# Patient Record
Sex: Male | Born: 1940 | Race: White | Hispanic: No | Marital: Married | Smoking: Never smoker
Health system: Southern US, Community
[De-identification: ages and names within clinical notes are randomized; demographics above are authoritative.]

## PROBLEM LIST (undated history)

## (undated) DIAGNOSIS — I639 Cerebral infarction, unspecified: Secondary | ICD-10-CM

## (undated) DIAGNOSIS — E7800 Pure hypercholesterolemia, unspecified: Secondary | ICD-10-CM

## (undated) DIAGNOSIS — I82409 Acute embolism and thrombosis of unspecified deep veins of unspecified lower extremity: Secondary | ICD-10-CM

## (undated) DIAGNOSIS — Z87438 Personal history of other diseases of male genital organs: Secondary | ICD-10-CM

## (undated) DIAGNOSIS — N319 Neuromuscular dysfunction of bladder, unspecified: Secondary | ICD-10-CM

## (undated) HISTORY — PX: BACK SURGERY: SHX140

---

## 2003-01-06 ENCOUNTER — Emergency Department (HOSPITAL_COMMUNITY): Admission: EM | Admit: 2003-01-06 | Discharge: 2003-01-07 | Payer: Self-pay | Admitting: Emergency Medicine

## 2008-10-04 ENCOUNTER — Emergency Department (HOSPITAL_COMMUNITY): Admission: EM | Admit: 2008-10-04 | Discharge: 2008-10-04 | Payer: Self-pay | Admitting: Emergency Medicine

## 2010-09-23 IMAGING — CR DG CERVICAL SPINE 1V CLEARING
1 series · 1 of 1 positions shown · non-contrast
Comparison: None.

CLINICAL DATA: Fell from ladder.  Neck pain.

LIMITED CERVICAL SPINE FOR TRAUMA CLEARING - 1 VIEW

[w c-spine lat *]
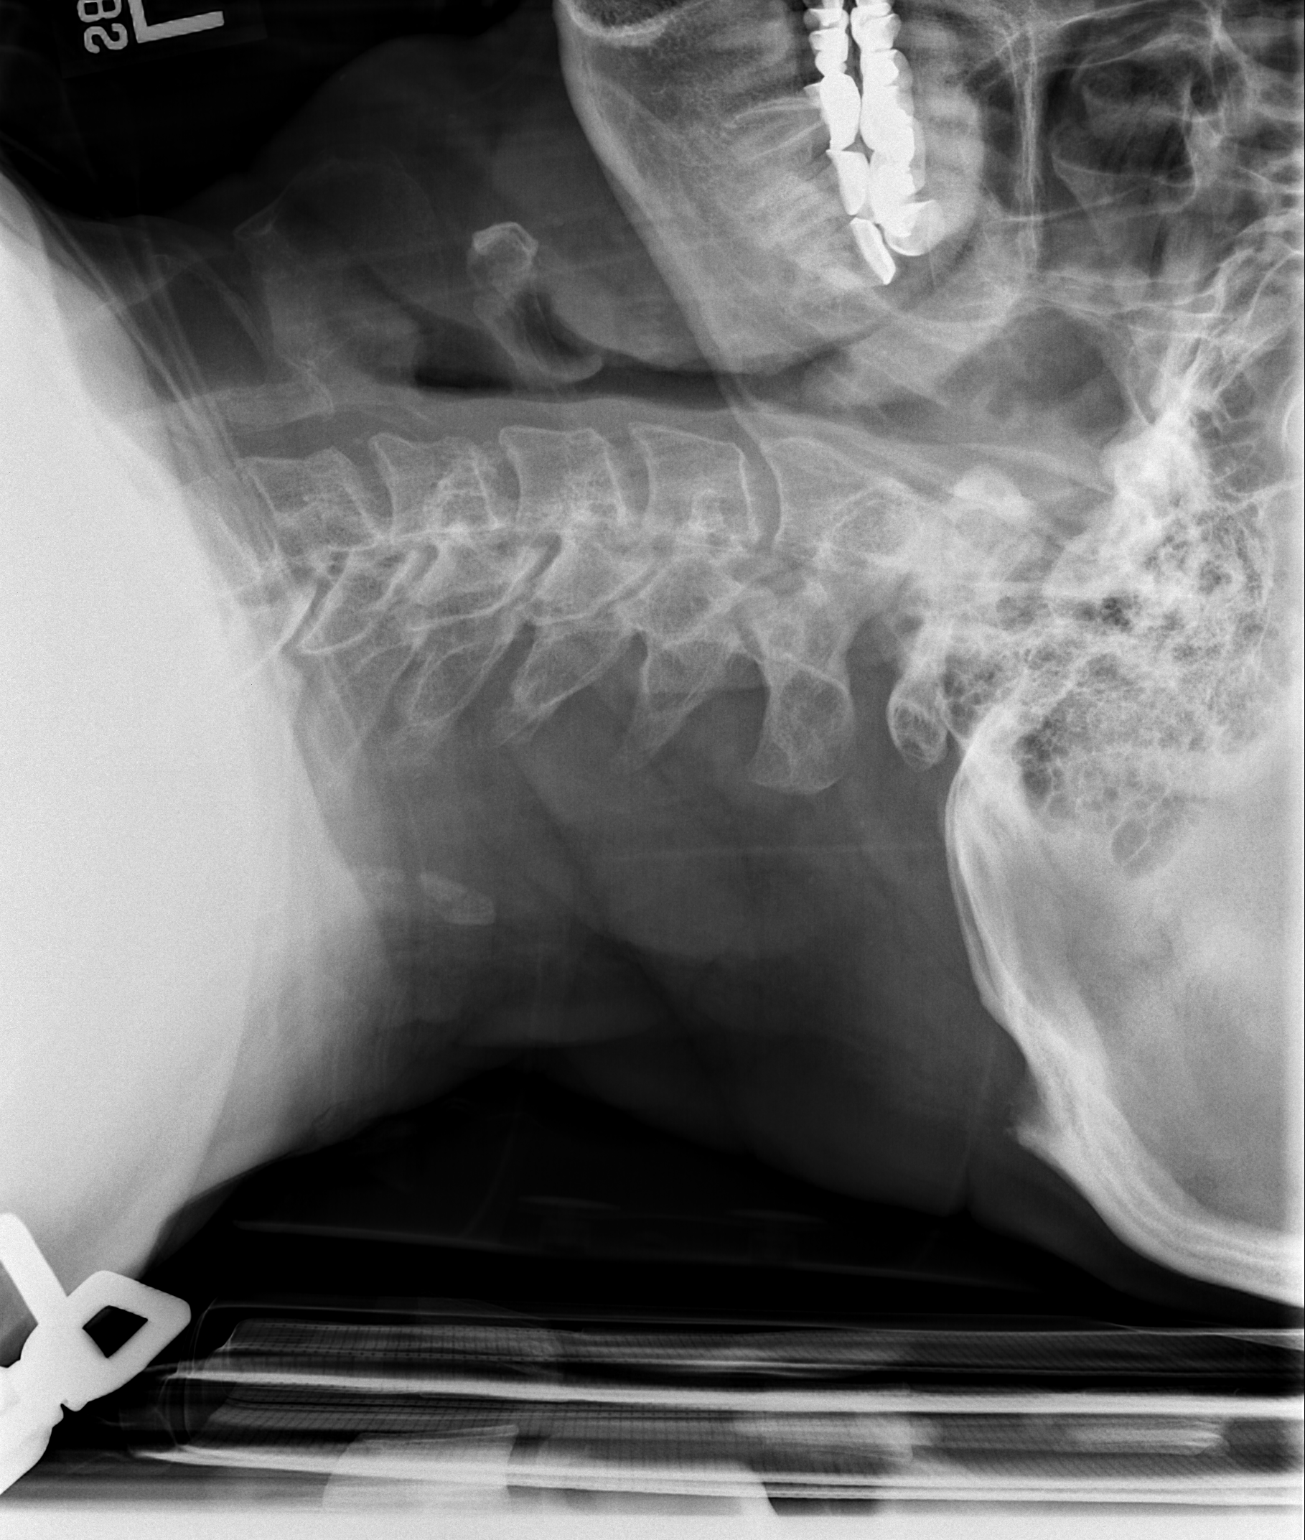

[1 of 1 positions shown; findings below may reference images not displayed]

FINDINGS: Lateral and swimmer's views were obtained.  Normal
alignment and no fracture.
IMPRESSION: Negative for fracture on clearing views of the cervical spine.

## 2010-09-23 IMAGING — CR DG CERVICAL SPINE COMPLETE 4+V
8 series · 8 of 8 positions shown · non-contrast
Comparison: None

CLINICAL DATA: Neck pain status post fall.

CERVICAL SPINE - COMPLETE 4+ VIEW

[t c-spine oblique (1 of 2)]
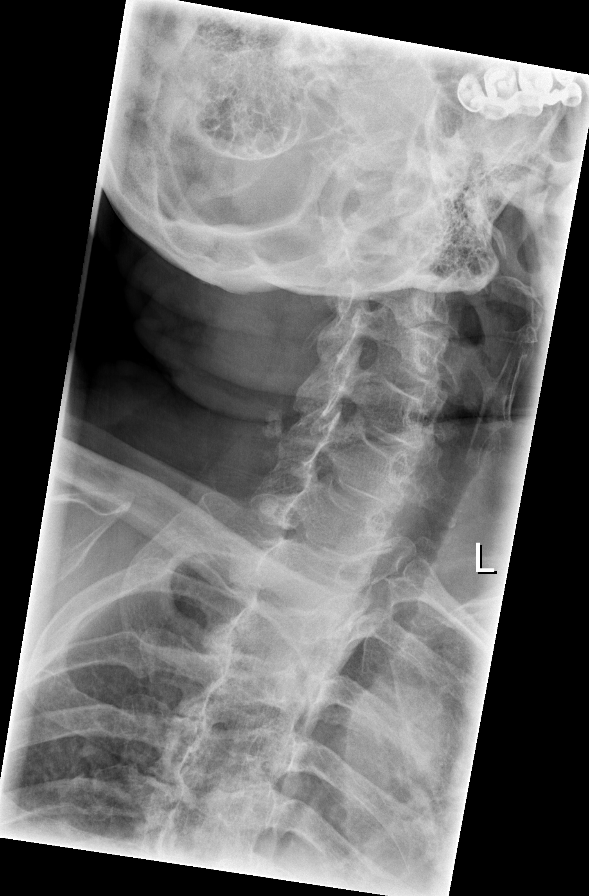

[t c-spine oblique (2 of 2)]
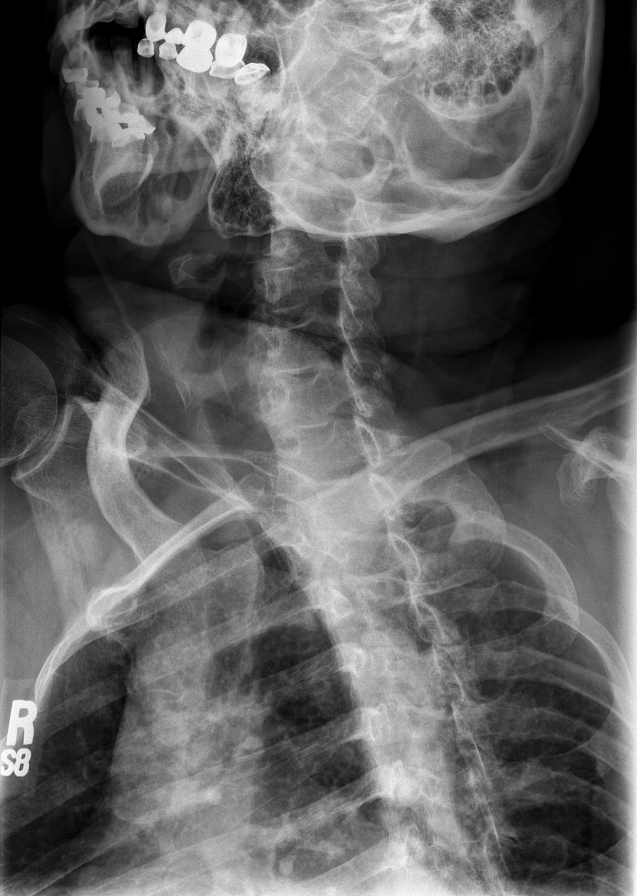

[t swimmers]
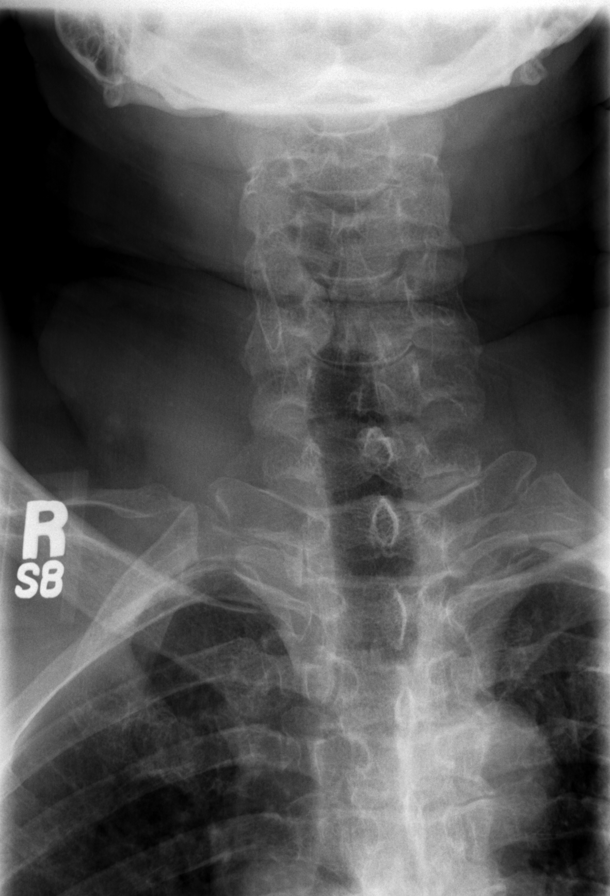

[t c-spine odontoid]
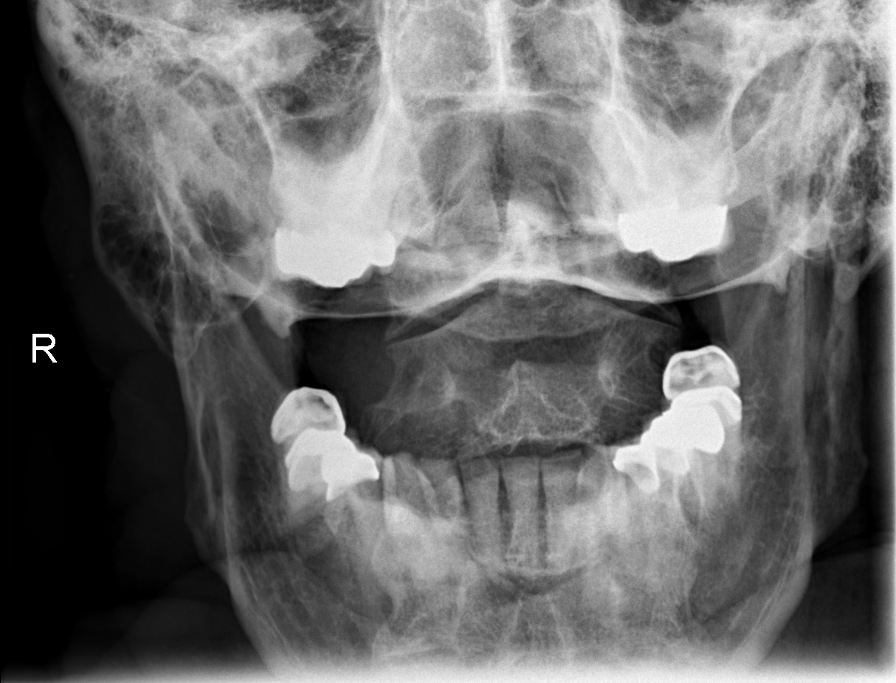

[t c-spine odontoid * (1 of 2)]
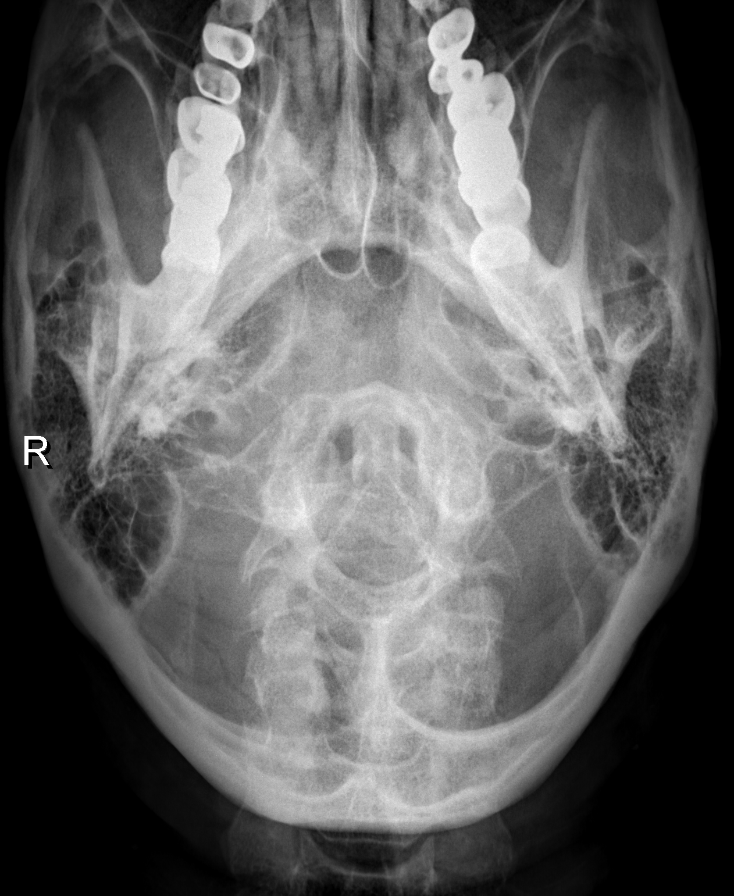

[t c-spine odontoid * (2 of 2)]
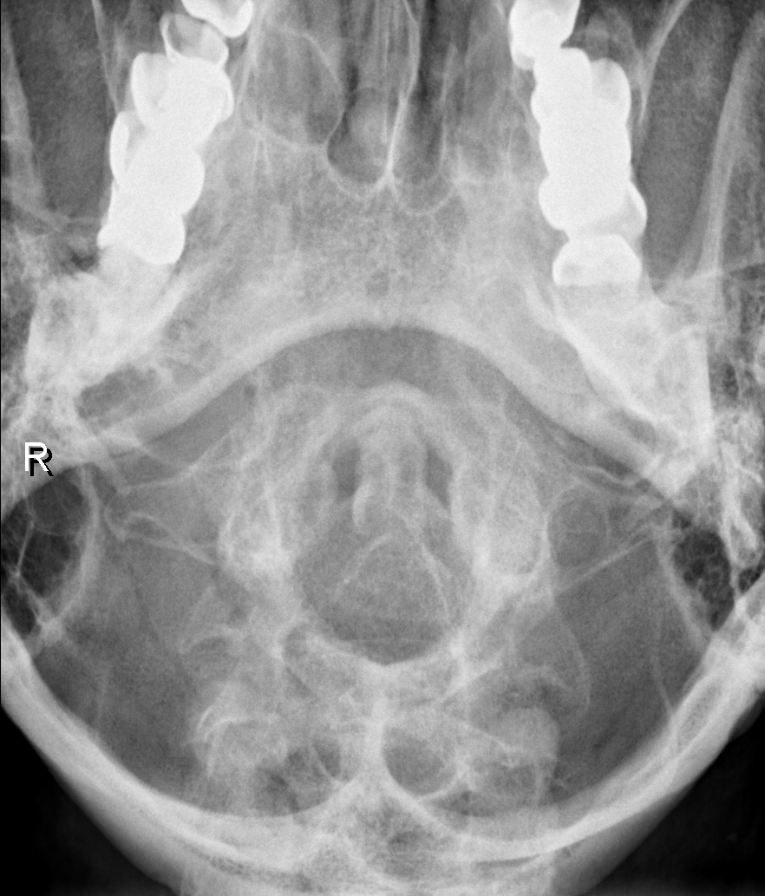

[w c-spine lat]
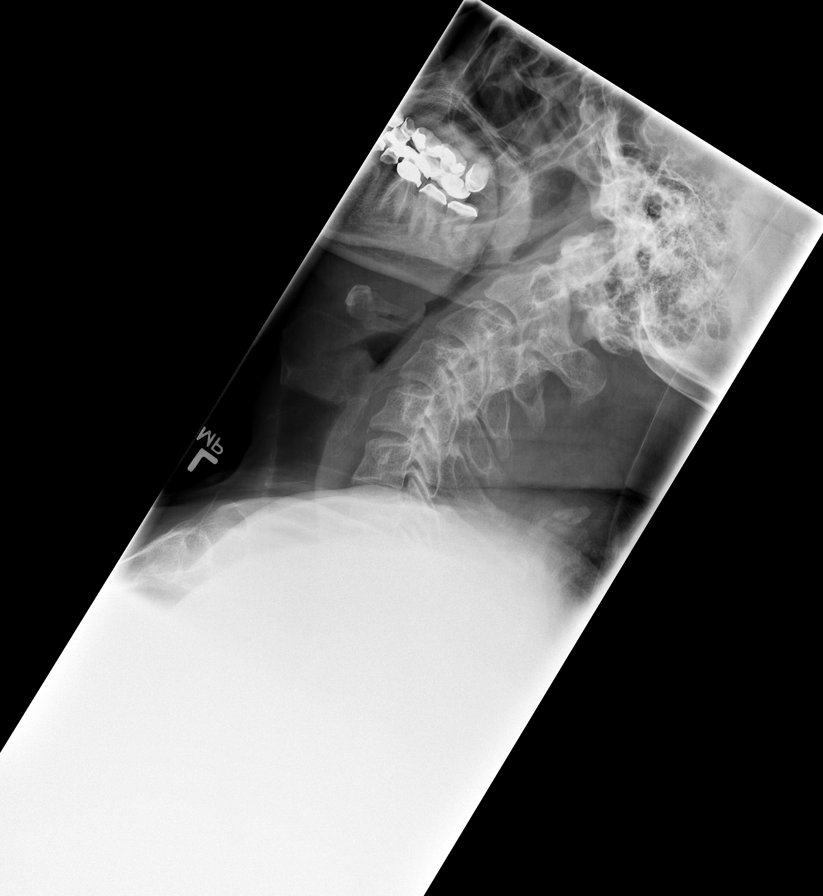

[w swimmers view *]
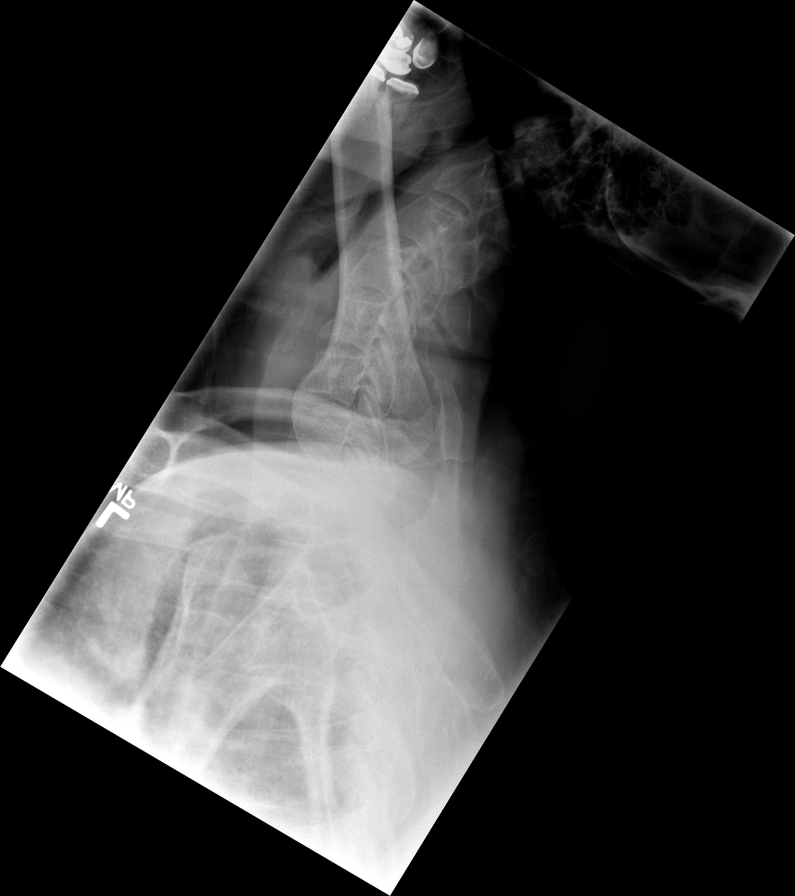

[8 of 8 positions shown; findings below may reference images not displayed]

FINDINGS: The alignment is normal.  On the routine lateral view,
the possibility of a superior endplate compression fracture at C7
is raised.  This is less apparent on the swimmer's view which is
limited by overlap with the humerus.  I cannot exclude some
prevertebral soft tissue swelling in this area.  No other acute
osseous findings are demonstrated.  There are scattered facet
degenerative changes.  The C1-C2 articulation appears normal in the
AP projection.
IMPRESSION: Cannot exclude compression deformity at C7.  CT of the cervical
spine is recommended for further evaluation.

Critical test results telephoned to Dr. Ps Emmanuel at the time of
interpretation on 10/04/2008 at 2511 hours.

## 2015-06-14 ENCOUNTER — Encounter (HOSPITAL_COMMUNITY): Payer: Self-pay | Admitting: Emergency Medicine

## 2015-06-14 ENCOUNTER — Ambulatory Visit (HOSPITAL_COMMUNITY)
Admission: EM | Admit: 2015-06-14 | Discharge: 2015-06-14 | Disposition: A | Discharge status: Home / Self Care | Payer: Medicare Other | Attending: Emergency Medicine | Admitting: Emergency Medicine

## 2015-06-14 DIAGNOSIS — Z79899 Other long term (current) drug therapy: Secondary | ICD-10-CM | POA: Insufficient documentation

## 2015-06-14 DIAGNOSIS — M791 Myalgia: Secondary | ICD-10-CM | POA: Diagnosis not present

## 2015-06-14 DIAGNOSIS — W57XXXA Bitten or stung by nonvenomous insect and other nonvenomous arthropods, initial encounter: Secondary | ICD-10-CM | POA: Diagnosis not present

## 2015-06-14 DIAGNOSIS — J069 Acute upper respiratory infection, unspecified: Secondary | ICD-10-CM | POA: Insufficient documentation

## 2015-06-14 DIAGNOSIS — R5381 Other malaise: Secondary | ICD-10-CM | POA: Diagnosis not present

## 2015-06-14 DIAGNOSIS — R509 Fever, unspecified: Secondary | ICD-10-CM | POA: Diagnosis not present

## 2015-06-14 DIAGNOSIS — T148 Other injury of unspecified body region: Secondary | ICD-10-CM

## 2015-06-14 MED ORDER — DOXYCYCLINE HYCLATE 100 MG PO CAPS: 100.0000 mg | ORAL_CAPSULE | Freq: Two times a day (BID) | ORAL | Status: DC

## 2015-06-14 NOTE — Discharge Instructions (Signed)
Fever, Adult A fever is an increase in the body's temperature. It is usually defined as a temperature of 100F (38C) or higher. Brief mild or moderate fevers generally have no long-term effects, and they often do not require treatment. Moderate or high fevers may make you feel uncomfortable and can sometimes be a sign of a serious illness or disease. The sweating that may occur with repeated or prolonged fever may also cause dehydration. Fever is confirmed by taking a temperature with a thermometer. A measured temperature can vary with:  Age.  Time of day.  Location of the thermometer:  Mouth (oral).  Rectum (rectal).  Ear (tympanic).  Underarm (axillary).  Forehead (temporal). HOME CARE INSTRUCTIONS Pay attention to any changes in your symptoms. Take these actions to help with your condition:  Take over-the counter and prescription medicines only as told by your health care provider. Follow the dosing instructions carefully.  If you were prescribed an antibiotic medicine, take it as told by your health care provider. Do not stop taking the antibiotic even if you start to feel better.  Rest as needed.  Drink enough fluid to keep your urine clear or pale yellow. This helps to prevent dehydration.  Sponge yourself or bathe with room-temperature water to help reduce your body temperature as needed. Do not use ice water.  Do not overbundle yourself in blankets or heavy clothes. SEEK MEDICAL CARE IF: 1. You vomit. 2. You cannot eat or drink without vomiting. 3. You have diarrhea. 4. You have pain when you urinate. 5. Your symptoms do not improve with treatment. 6. You develop new symptoms. 7. You develop excessive weakness. SEEK IMMEDIATE MEDICAL CARE IF:  You have shortness of breath or have trouble breathing.  You are dizzy or you faint.  You are disoriented or confused.  You develop signs of dehydration, such as a dry mouth, decreased urination, or paleness.  You  develop severe pain in your abdomen.  You have persistent vomiting or diarrhea.  You develop a skin rash.  Your symptoms suddenly get worse.   This information is not intended to replace advice given to you by your health care provider. Make sure you discuss any questions you have with your health care provider.   Document Released: 08/04/2000 Document Revised: 10/30/2014 Document Reviewed: 04/04/2014 Elsevier Interactive Patient Education 2016 Elsevier Inc.  Tick Bite Information Ticks are insects that attach themselves to the skin and draw blood for food. There are various types of ticks. Common types include wood ticks and deer ticks. Most ticks live in shrubs and grassy areas. Ticks can climb onto your body when you make contact with leaves or grass where the tick is waiting. The most common places on the body for ticks to attach themselves are the scalp, neck, armpits, waist, and groin. Most tick bites are harmless, but sometimes ticks carry germs that cause diseases. These germs can be spread to a person during the tick's feeding process. The chance of a disease spreading through a tick bite depends on:   The type of tick.  Time of year.   How long the tick is attached.   Geographic location.  HOW CAN YOU PREVENT TICK BITES? Take these steps to help prevent tick bites when you are outdoors:  Wear protective clothing. Long sleeves and long pants are best.   Wear white clothes so you can see ticks more easily.  Tuck your pant legs into your socks.   If walking on a trail, stay in  the middle of the trail to avoid brushing against bushes.  Avoid walking through areas with long grass.  Put insect repellent on all exposed skin and along boot tops, pant legs, and sleeve cuffs.   Check clothing, hair, and skin repeatedly and before going inside.   Brush off any ticks that are not attached.  Take a shower or bath as soon as possible after being outdoors.  WHAT IS  THE PROPER WAY TO REMOVE A TICK? Ticks should be removed as soon as possible to help prevent diseases caused by tick bites. 8. If latex gloves are available, put them on before trying to remove a tick.  9. Using fine-point tweezers, grasp the tick as close to the skin as possible. You may also use curved forceps or a tick removal tool. Grasp the tick as close to its head as possible. Avoid grasping the tick on its body. 10. Pull gently with steady upward pressure until the tick lets go. Do not twist the tick or jerk it suddenly. This may break off the tick's head or mouth parts. 11. Do not squeeze or crush the tick's body. This could force disease-carrying fluids from the tick into your body.  12. After the tick is removed, wash the bite area and your hands with soap and water or other disinfectant such as alcohol. 13. Apply a small amount of antiseptic cream or ointment to the bite site.  14. Wash and disinfect any instruments that were used.  Do not try to remove a tick by applying a hot match, petroleum jelly, or fingernail polish to the tick. These methods do not work and may increase the chances of disease being spread from the tick bite.  WHEN SHOULD YOU SEEK MEDICAL CARE? Contact your health care provider if you are unable to remove a tick from your skin or if a part of the tick breaks off and is stuck in the skin.  After a tick bite, you need to be aware of signs and symptoms that could be related to diseases spread by ticks. Contact your health care provider if you develop any of the following in the days or weeks after the tick bite:  Unexplained fever.  Rash. A circular rash that appears days or weeks after the tick bite may indicate the possibility of Lyme disease. The rash may resemble a target with a bull's-eye and may occur at a different part of your body than the tick bite.  Redness and swelling in the area of the tick bite.   Tender, swollen lymph glands.   Diarrhea.    Weight loss.   Cough.   Fatigue.   Muscle, joint, or bone pain.   Abdominal pain.   Headache.   Lethargy or a change in your level of consciousness.  Difficulty walking or moving your legs.   Numbness in the legs.   Paralysis.  Shortness of breath.   Confusion.   Repeated vomiting.    This information is not intended to replace advice given to you by your health care provider. Make sure you discuss any questions you have with your health care provider.   Document Released: 02/06/2000 Document Revised: 03/01/2014 Document Reviewed: 07/19/2012 Elsevier Interactive Patient Education [employer] Elsevier Inc.

## 2015-06-14 NOTE — ED Provider Notes (Signed)
CSN: [id number]     Arrival date & time 06/14/15  1919 History   First MD Initiated Contact with Patient 06/14/15 1939     Chief Complaint  Patient presents with  . URI   (Consider location/radiation/quality/duration/timing/severity/associated sxs/prior Treatment) HPI Comments: 75 year old male states that for the past couple days he has felt sick. He states that this morning he had a temperature of 100.5. He is also having body achesand pain in the muscles behind his neck and shoulders. Sometimes these muscles will shake. He denies headache or rash.He does state that he removed 6 ticks this week. Some were not attached, none were engorged.  Patient is a 75 y.o. male presenting with URI.  URI Presenting symptoms: fever   Presenting symptoms: no cough and no fatigue   Associated symptoms: myalgias   Associated symptoms: no headaches and no wheezing     History reviewed. No pertinent past medical history. History reviewed. No pertinent past surgical history. No family history on file. Social History  Substance Use Topics  . Smoking status: Never Smoker   . Smokeless tobacco: None  . Alcohol Use: No    Review of Systems  Constitutional: Positive for fever and activity change. Negative for fatigue.  HENT: Negative.   Eyes: Negative for visual disturbance.  Respiratory: Negative.  Negative for cough, chest tightness, shortness of breath and wheezing.   Cardiovascular: Negative for chest pain and leg swelling.  Gastrointestinal: Negative.   Genitourinary: Negative.   Musculoskeletal: Positive for myalgias.  Skin: Negative.   Neurological: Negative.  Negative for dizziness, tremors, weakness, numbness and headaches.  Psychiatric/Behavioral: Negative.     Allergies  Review of patient's allergies indicates no known allergies.  Home Medications   Prior to Admission medications   Medication Sig Start Date End Date Taking? Authorizing Provider  ezetimibe (ZETIA) 10 MG tablet  Take 10 mg by mouth daily.   Yes Historical Provider, MD  gabapentin (NEURONTIN) 300 MG capsule Take 300 mg by mouth 3 (three) times daily.   Yes Historical Provider, MD   Meds Ordered and Administered this Visit  Medications - No data to display  BP 131/85 mmHg  Pulse 107  Temp(Src) 98.3 F (36.8 C) (Oral)  Resp 18  SpO2 95% No data found.   Physical Exam  Constitutional: He appears well-developed and well-nourished. No distress.  HENT:  Head: Normocephalic and atraumatic.  Mouth/Throat: Oropharynx is clear and moist. No oropharyngeal exudate.  Eyes: Conjunctivae and EOM are normal.  Neck: Normal range of motion. Neck supple.  Cardiovascular: Regular rhythm and normal heart sounds.   Mild tachycardia.  Pulmonary/Chest: Effort normal and breath sounds normal. No respiratory distress. He has no wheezes. He has no rales.  Abdominal: Soft. There is no tenderness.  Musculoskeletal: He exhibits tenderness.  Lymphadenopathy:    He has no cervical adenopathy.  Neurological: He is alert. He exhibits normal muscle tone.  Skin: Skin is warm and dry.  Psychiatric: He has a normal mood and affect.  Nursing note and vitals reviewed.   ED Course  Procedures (including critical care time)  Labs Review Labs Reviewed  ROCKY MTN SPOTTED FVR ABS PNL(IGG+IGM)  B. BURGDORFI ANTIBODIES    Imaging Review No results found.   Visual Acuity Review  Right Eye Distance:   Left Eye Distance:   Bilateral Distance:    Right Eye Near:   Left Eye Near:    Bilateral Near:         MDM   1. Fever,  unspecified fever cause   2. Malaise   3. Tick bite    Labs for RMSF and Lyme Empiric tx with doxy 100 bid. For new sx's, worsening or problems either see your MD, return or go to the ED       Hayden Rasmussen, NP 06/14/15 1959

## 2015-06-14 NOTE — ED Notes (Signed)
C/o cold like sx onset today associated w/fevers, BA, muscle spasms Reports he has removed 6 deer ticks from body w/in this past week A&O x4... No acute distress.

## 2015-06-16 LAB — B. BURGDORFI ANTIBODIES: B burgdorferi Ab IgG+IgM: <0.91 {ISR} (ref 0.00–0.90)

## 2015-06-17 LAB — RMSF, IGG, IFA: RMSF, IGG, IFA: 1:64 {titer} — ABNORMAL HIGH

## 2015-06-17 LAB — ROCKY MTN SPOTTED FVR ABS PNL(IGG+IGM)
RMSF IgG: POSITIVE — AB
RMSF IgM: 0.15 index (ref 0.00–0.89)

## 2015-09-28 ENCOUNTER — Encounter (HOSPITAL_COMMUNITY): Payer: Self-pay | Admitting: *Deleted

## 2015-09-28 ENCOUNTER — Emergency Department (HOSPITAL_COMMUNITY)
Admission: EM | Admit: 2015-09-28 | Discharge: 2015-09-28 | Disposition: A | Discharge status: Home / Self Care | Payer: Medicare Other | Attending: Physician Assistant | Admitting: Physician Assistant

## 2015-09-28 DIAGNOSIS — R21 Rash and other nonspecific skin eruption: Secondary | ICD-10-CM | POA: Diagnosis present

## 2015-09-28 MED ORDER — DIPHENHYDRAMINE HCL 25 MG PO CAPS
50.0000 mg | ORAL_CAPSULE | Freq: Once | ORAL | Status: AC
  Administered 2015-09-28: 50 mg via ORAL
  Filled 2015-09-28: qty 2

## 2015-09-28 MED ORDER — PREDNISONE 20 MG PO TABS: 40.0000 mg | ORAL_TABLET | Freq: Every day | ORAL | 0 refills | Status: DC

## 2015-09-28 MED ORDER — FAMOTIDINE 20 MG PO TABS
20.0000 mg | ORAL_TABLET | Freq: Once | ORAL | Status: AC
  Administered 2015-09-28: 20 mg via ORAL
  Filled 2015-09-28: qty 1

## 2015-09-28 MED ORDER — DEXAMETHASONE SODIUM PHOSPHATE 10 MG/ML IJ SOLN
10.0000 mg | Freq: Once | INTRAMUSCULAR | Status: AC
  Administered 2015-09-28: 10 mg via INTRAMUSCULAR
  Filled 2015-09-28: qty 1

## 2015-09-28 NOTE — Discharge Instructions (Signed)
It is important for you to follow-up with your dermatologist on Monday for reevaluation. Take your medications as prescribed to help with your rash. He may continue taking Benadryl and Zantac or Pepcid as we discussed to help with the itching. Return to ED for new or worsening symptoms as we discussed.

## 2015-09-28 NOTE — ED Provider Notes (Signed)
[facility]-EMERGENCY DEPT Provider Note   CSN: [id number] date & time: 09/28/15  1308  First Provider Contact:  None       History   Chief Complaint Chief Complaint  Patient presents with  . Rash    HPI Seth Hicks is a 75 y.o. male.  HPI here for evaluation of rash. Patient reports rash started on Friday around his belly button and has since spread throughout his trunk, back and all extremities. Reports rash is intensely itchy and burns. Has taken oral Benadryl without relief. Denies fevers, chills, chest pain, shortness of breath, nausea or vomiting, abdominal pain, urinary or bowel symptoms, numbness or weakness. He denies any recent travel, medication changes, antibiotic use, environmental exposures/changes in detergents, lotions, foods, etc. No known allergies. No other modifying factors.   History reviewed. No pertinent past medical history.  There are no active problems to display for this patient.   History reviewed. No pertinent surgical history.     Home Medications    Prior to Admission medications   Medication Sig Start Date End Date Taking? Authorizing Provider  doxycycline (VIBRAMYCIN) 100 MG capsule Take 1 capsule (100 mg total) by mouth 2 (two) times daily. 06/14/15   Hayden Rasmussen, NP  ezetimibe (ZETIA) 10 MG tablet Take 10 mg by mouth daily.    Historical Provider, MD  gabapentin (NEURONTIN) 300 MG capsule Take 300 mg by mouth 3 (three) times daily.    Historical Provider, MD  predniSONE (DELTASONE) 20 MG tablet Take 2 tablets (40 mg total) by mouth daily. 09/28/15   Joycie Peek, PA-C    Family History No family history on file.  Social History Social History  Substance Use Topics  . Smoking status: Never Smoker  . Smokeless tobacco: Never Used  . Alcohol use No     Allergies   Review of patient's allergies indicates no known allergies.   Review of Systems Review of Systems A 10 point review of systems was completed and was negative  except for pertinent positives and negatives as mentioned in the history of present illness    Physical Exam Updated Vital Signs BP 126/85 (BP Location: Left Arm)   Pulse 61   Temp 97.5 F (36.4 C) (Oral)   Resp 18   SpO2 98%   Physical Exam  Constitutional: He appears well-developed. No distress.  Awake, alert and nontoxic in appearance  HENT:  Head: Normocephalic and atraumatic.  Right Ear: External ear normal.  Left Ear: External ear normal.  Mouth/Throat: Oropharynx is clear and moist.  Eyes: Conjunctivae and EOM are normal. Pupils are equal, round, and reactive to light.  Neck: Normal range of motion. No JVD present.  Cardiovascular: Normal rate, regular rhythm and normal heart sounds.   Pulmonary/Chest: Effort normal and breath sounds normal. No stridor.  Abdominal: Soft. There is no tenderness.  Musculoskeletal: Normal range of motion.  Neurological:  Awake, alert, cooperative and aware of situation; motor strength bilaterally; sensation normal to light touch bilaterally; no facial asymmetry; tongue midline; major cranial nerves appear intact;  baseline gait without new ataxia.  Skin: Rash noted. He is not diaphoretic.  Small, erythematous, vesicular lesions. No coalescence. No dermatomal pattern  Psychiatric: He has a normal mood and affect. His behavior is normal. Thought content normal.  Nursing note and vitals reviewed.        ED Treatments / Results  Labs (all labs ordered are listed, but only abnormal results are displayed) Labs Reviewed - No data to display  EKG  EKG Interpretation None       Radiology No results found.  Procedures Procedures (including critical care time)  Medications Ordered in ED Medications  dexamethasone (DECADRON) injection 10 mg (10 mg Intramuscular Given 09/28/15 1216)  diphenhydrAMINE (BENADRYL) capsule 50 mg (50 mg Oral Given 09/28/15 1215)  famotidine (PEPCID) tablet 20 mg (20 mg Oral Given 09/28/15 1215)     Initial  Impression / Assessment and Plan / ED Course  I have reviewed the triage vital signs and the nursing notes.  Pertinent labs & imaging results that were available during my care of the patient were reviewed by me and considered in my medical decision making (see chart for details).  Clinical Course   Vitals:   09/28/15 0913 09/28/15 1131  BP: 121/81 126/85  Pulse: 62 61  Resp: 18 18  Temp: 97.7 F (36.5 C) 97.5 F (36.4 C)  TempSrc: Oral Oral  SpO2: 97% 98%     Patient with nonspecific rash. No inciting factors. Low suspicion for emergent etiology. No anaphylaxis. Given Benadryl, Pepcid and Decadron and emergent department. Plan to DC with prednisone. Continue Benadryl and H2 RA for itching. Patient to follow-up with dermatologist on Monday. Overall appears very well, nontoxic, hemodynamically stable and appropriate for discharge. Prior to patient discharge, I discussed and reviewed this case with Dr. Corlis LeakMackuen     Final Clinical Impressions(s) / ED Diagnoses   Final diagnoses:  Rash    New Prescriptions New Prescriptions   PREDNISONE (DELTASONE) 20 MG TABLET    Take 2 tablets (40 mg total) by mouth daily.     Joycie PeekBenjamin Markez [place], PA-C 09/28/15 1304    Courteney Randall AnLyn Mackuen, MD 09/28/15 1811

## 2015-09-28 NOTE — ED Triage Notes (Signed)
Pt complains of rash from his torso to below his knees, which became worse since Friday. Pt states he initially though he had a flea bite. Pt denies exposure to any new soaps, lotions.

## 2017-08-14 ENCOUNTER — Encounter (HOSPITAL_COMMUNITY): Payer: Self-pay | Admitting: Emergency Medicine

## 2017-08-14 ENCOUNTER — Ambulatory Visit (HOSPITAL_COMMUNITY)
Admission: EM | Admit: 2017-08-14 | Discharge: 2017-08-14 | Disposition: A | Discharge status: Home / Self Care | Payer: Medicare Other | Attending: Family Medicine | Admitting: Family Medicine

## 2017-08-14 ENCOUNTER — Other Ambulatory Visit: Payer: Self-pay

## 2017-08-14 DIAGNOSIS — L600 Ingrowing nail: Secondary | ICD-10-CM

## 2017-08-14 HISTORY — DX: Neuromuscular dysfunction of bladder, unspecified: N31.9

## 2017-08-14 HISTORY — DX: Acute embolism and thrombosis of unspecified deep veins of unspecified lower extremity: I82.409

## 2017-08-14 HISTORY — DX: Pure hypercholesterolemia, unspecified: E78.00

## 2017-08-14 HISTORY — DX: Personal history of other diseases of male genital organs: Z87.438

## 2017-08-14 NOTE — ED Triage Notes (Signed)
[facility] a [facility] of right great toe pain x 2 days that he felt was related to the toe nail.

## 2017-08-14 NOTE — ED Provider Notes (Signed)
[facility]   [id number] 08/14/17 Arrival Time: 1423   SUBJECTIVE:  David StallJoseph Quinter is a 77 y.o. male who presents to the urgent care with complaint of right great toe pain x 2 days that he felt was related to the toe nail.   Past Medical History:  Diagnosis Date  . DVT (deep venous thrombosis) (HCC)   . History of BPH   . Hypercholesteremia   . Neurogenic bladder    History reviewed. No pertinent family history. Social History   Socioeconomic History  . Marital status: Married    Spouse name: Not on file  . Number of children: Not on file  . Years of education: Not on file  . Highest education level: Not on file  Occupational History  . Not on file  Social Needs  . Financial resource strain: Not on file  . Food insecurity:    Worry: Not on file    Inability: Not on file  . Transportation needs:    Medical: Not on file    Non-medical: Not on file  Tobacco Use  . Smoking status: Never Smoker  . Smokeless tobacco: Never Used  Substance and Sexual Activity  . Alcohol use: No  . Drug use: Never  . Sexual activity: Not on file  Lifestyle  . Physical activity:    Days per week: Not on file    Minutes per session: Not on file  . Stress: Not on file  Relationships  . Social connections:    Talks on phone: Not on file    Gets together: Not on file    Attends religious service: Not on file    Active member of club or organization: Not on file    Attends meetings of clubs or organizations: Not on file    Relationship status: Not on file  . Intimate partner violence:    Fear of current or ex partner: Not on file    Emotionally abused: Not on file    Physically abused: Not on file    Forced sexual activity: Not on file  Other Topics Concern  . Not on file  Social History Narrative  . Not on file   Current Meds  Medication Sig  . aspirin EC 81 MG tablet Take 81 mg by mouth daily.  Marland Kitchen. ezetimibe (ZETIA) 10 MG tablet Take 10 mg by mouth daily.  .  Rivaroxaban (XARELTO PO) Take by mouth.  . tamsulosin (FLOMAX) 0.4 MG CAPS capsule Take 0.4 mg by mouth.   Allergies  Allergen Reactions  . Codeine Nausea Only    All derivatives of codeine patient stated.      ROS: As per HPI, remainder of ROS negative.   OBJECTIVE:   Vitals:   08/14/17 1513  BP: 127/78  Pulse: 89  Resp: 18  Temp: 98.1 F (36.7 C)  TempSrc: Oral  SpO2: 99%     General appearance: alert; no distress Eyes: PERRL; EOMI; conjunctiva normal HENT: normocephalic; atraumatic;  oral mucosa normal Neck: supple Back: no CVA tenderness Extremities: no cyanosis or edema; tender swollen distal right great toe with fluctuance Skin: warm and dry Neurologic: normal gait; grossly normal Psychological: alert and cooperative; normal mood and affect      Labs:  Results for orders placed or performed during the hospital encounter of 06/14/15  Rocky mtn spotted fvr abs pnl(IgG+IgM)  Result Value Ref Range   RMSF IgG Positive (A) Negative   RMSF IgM 0.15 0.00 - 0.89 index  B. burgdorfi  antibodies  Result Value Ref Range   B burgdorferi Ab IgG+IgM <0.91 0.00 - 0.90 ISR  RMSF, IgG, IFA  Result Value Ref Range   RMSF, IGG, IFA 1:64 (H) Neg <1:64    Labs Reviewed - No data to display  No results found.     ASSESSMENT & PLAN:  1. Ingrown toenail     No orders of the defined types were placed in this encounter.   Reviewed expectations re: course of current medical issues. Questions answered. Outlined signs and symptoms indicating need for more acute intervention. Patient verbalized understanding. After Visit Summary given.    Procedures: Right great toe was anesthetized with local infiltration of 2% Xylocaine.  The tibial aspect of the nail was then excised completely.  Small amount of pus was expressed and the nailbed was cleaned with Betadine.  A sterile dressing was then applied.      Elvina Sidle, MD 08/14/17 [phone]

## 2017-08-14 NOTE — Discharge Instructions (Addendum)
Soak the foot in soapy water twice daily for three days.

## 2019-03-09 ENCOUNTER — Ambulatory Visit: Payer: Medicare PPO

## 2019-03-21 ENCOUNTER — Ambulatory Visit: Payer: Medicare PPO

## 2019-03-30 ENCOUNTER — Ambulatory Visit: Payer: Medicare PPO

## 2019-04-02 ENCOUNTER — Ambulatory Visit
Admission: EM | Admit: 2019-04-02 | Discharge: 2019-04-02 | Disposition: A | Discharge status: Home / Self Care | Payer: Medicare PPO

## 2019-04-02 ENCOUNTER — Encounter: Payer: Self-pay | Admitting: Emergency Medicine

## 2019-04-02 ENCOUNTER — Other Ambulatory Visit: Payer: Self-pay

## 2019-04-02 DIAGNOSIS — M79601 Pain in right arm: Secondary | ICD-10-CM

## 2019-04-02 HISTORY — DX: Cerebral infarction, unspecified: I63.9

## 2019-04-02 NOTE — ED Provider Notes (Signed)
[facility]    CSN: [id number] Arrival date & time: 04/02/19  1058      History   Chief Complaint Chief Complaint  Patient presents with  . Arm Pain    HPI Seth Hicks is a 79 y.o. male with history of DVT presenting for evaluation of right forearm pain.  States that he hit his arm against the mirror of his wife's car when he was walking past about 3 weeks ago.  Denies open wound, discoloration thereafter.  Has had full active ROM without significant swelling.  Used ice to help with this.  Of note, patient has not had DVT since being on Xarelto: Denies missing doses, hematochezia, melena, severe abdominal pain, palpitations.    Past Medical History:  Diagnosis Date  . DVT (deep venous thrombosis) ([place])   . History of BPH   . Hypercholesteremia   . Neurogenic bladder   . Stroke [facility])     There are no problems to display for this patient.   Past Surgical History:  Procedure Laterality Date  . BACK SURGERY         Home Medications    Prior to Admission medications   Medication Sig Start Date End Date Taking? Authorizing Provider  calcium-vitamin D 250-100 MG-UNIT tablet Take 1 tablet by mouth 2 (two) times daily.   Yes [provider]  co-enzyme Q-10 30 MG capsule Take 30 mg by mouth 3 (three) times daily.   Yes [provider]  finasteride (PROPECIA) 1 MG tablet Take 1 mg by mouth daily.   Yes [provider]  magnesium oxide (MAG-OX) 400 MG tablet Take 400 mg by mouth daily.   Yes [provider]  Multiple Vitamin (MULTIVITAMIN) capsule Take 1 capsule by mouth daily.   Yes [provider]  Omega-3 Fatty Acids (FISH OIL ADULT GUMMIES PO) Take by mouth.   Yes [provider]  aspirin EC 81 MG tablet Take 81 mg by mouth daily.    [provider]  ezetimibe (ZETIA) 10 MG tablet Take 10 mg by mouth daily.    [provider]  Rivaroxaban (XARELTO PO) Take by mouth.    [provider]  tamsulosin (FLOMAX) 0.4 MG CAPS capsule Take 0.4 mg by mouth.    [provider]    Family History Family History  Problem Relation Age of Onset  . Diabetes Mother   . Colon cancer Mother   . Stroke Father   . Hypertension Father     Social History Social History   Tobacco Use  . Smoking status: Never Smoker  . Smokeless tobacco: Never Used  Substance Use Topics  . Alcohol use: No  . Drug use: Never     Allergies   Codeine   Review of Systems As per HPI   Physical Exam Triage Vital Signs ED Triage Vitals  Enc Vitals Group     BP 04/02/19 1113 130/84     Pulse Rate 04/02/19 1113 90     Resp 04/02/19 1113 16     Temp 04/02/19 1113 97.7 F (36.5 C)     Temp Source 04/02/19 1113 Oral     SpO2 04/02/19 1113 94 %     Weight --      Height --      Head Circumference --      Peak Flow --      Pain Score 04/02/19 1117 0     Pain Loc --  Pain Edu? --      Excl. in GC? --    No data found.  Updated Vital Signs BP 130/84 (BP Location: Left Arm)   Pulse 90   Temp 97.7 F (36.5 C) (Oral)   Resp 16   SpO2 94%   Visual Acuity Right Eye Distance:   Left Eye Distance:   Bilateral Distance:    Right Eye Near:   Left Eye Near:    Bilateral Near:     Physical Exam Constitutional:      General: He is not in acute distress. HENT:     Head: Normocephalic and atraumatic.  Eyes:     General: No scleral icterus.    Pupils: Pupils are equal, round, and reactive to light.  Cardiovascular:     Rate and Rhythm: Normal rate.  Pulmonary:     Effort: Pulmonary effort is normal. No respiratory distress.     Breath sounds: No wheezing.  Musculoskeletal:     Comments: Right lateral forearm mildly tender at proximal aspect.  No bony tenderness in elbow, wrist.  Full active ROM of wrists, elbow, shoulder.  No pitting edema or discoloration.  No cords palpated.  Skin:    Capillary Refill: Capillary refill takes less than 2 seconds.      Coloration: Skin is not jaundiced or pale.     Findings: No bruising, erythema or rash.  Neurological:     Mental Status: He is alert and oriented to person, place, and time.     Sensory: No sensory deficit.     Motor: No weakness.     Coordination: Coordination normal.     Deep Tendon Reflexes: Reflexes normal.      UC Treatments / Results  Labs (all labs ordered are listed, but only abnormal results are displayed) Labs Reviewed - No data to display  EKG   Radiology No results found.  Procedures Procedures (including critical care time)  Medications Ordered in UC Medications - No data to display  Initial Impression / Assessment and Plan / UC Course  I have reviewed the triage vital signs and the nursing notes.  Pertinent labs & imaging results that were available during my care of the patient were reviewed by me and considered in my medical decision making (see chart for details).     Patient afebrile, nontoxic in office today.  Pain is mild on exam and has been steadily improving since injury 3 weeks ago.  Most significant to patient's history is to DVTs, though he has not had any recurrence since being on Xarelto.  Reports compliance with this.  No prolonged bleeding, bruising identified.  Exam unremarkable: Low concern for upper extremity thrombus at this time.  We will continue Xarelto, use heating pads 3 times daily to help with any residual pain, swelling, possible superficial thrombophlebitis despite this being less likely.  Return precautions discussed, patient verbalized understanding and is agreeable to plan. Final Clinical Impressions(s) / UC Diagnoses   Final diagnoses:  Right arm pain     Discharge Instructions     Recommend heating pad 3 times a day and continuing xarelto. Go to ER for worsening pain/swelling, discoloration, chest pain, palpitations, difficulty breathing.    ED Prescriptions    None     PDMP not reviewed this encounter.     Hall[place]Potvin, [place], [place] 04/02/19 2016

## 2019-04-02 NOTE — Discharge Instructions (Addendum)
Recommend heating pad 3 times a day and continuing xarelto. Go to ER for worsening pain/swelling, discoloration, chest pain, palpitations, difficulty breathing.

## 2019-04-02 NOTE — ED Triage Notes (Addendum)
Pt presents to [facility] for assessment after 3 weeks ago he hit his right forearm on his wife's car mirror.  States no bruising developed, but he is still having pain with certain movements.  Hx of DVT, and was concerned.  Denies swelling to hand or arm.  Denies loss of strength.  Patient is currently on Xarelto.

## 2019-04-02 NOTE — ED Notes (Signed)
Patient able to ambulate independently  

## 2019-04-11 ENCOUNTER — Ambulatory Visit: Payer: Medicare PPO

## 2019-08-29 ENCOUNTER — Ambulatory Visit (INDEPENDENT_AMBULATORY_CARE_PROVIDER_SITE_OTHER): Payer: Medicare PPO

## 2019-08-29 ENCOUNTER — Other Ambulatory Visit: Payer: Self-pay

## 2019-08-29 ENCOUNTER — Ambulatory Visit (HOSPITAL_COMMUNITY)
Admission: EM | Admit: 2019-08-29 | Discharge: 2019-08-29 | Disposition: A | Discharge status: Home / Self Care | Payer: Medicare PPO | Attending: Internal Medicine | Admitting: Internal Medicine

## 2019-08-29 ENCOUNTER — Encounter (HOSPITAL_COMMUNITY): Payer: Self-pay | Admitting: Emergency Medicine

## 2019-08-29 DIAGNOSIS — S92515A Nondisplaced fracture of proximal phalanx of left lesser toe(s), initial encounter for closed fracture: Secondary | ICD-10-CM | POA: Diagnosis not present

## 2019-08-29 DIAGNOSIS — M79672 Pain in left foot: Secondary | ICD-10-CM | POA: Diagnosis not present

## 2019-08-29 NOTE — ED Provider Notes (Signed)
[facility]    CSN: [id number] Arrival date & time: 08/29/19  1219      History   Chief Complaint Chief Complaint  Patient presents with  . Toe Pain    HPI Seth Hicks is a 79 y.o. male.   [name] presents with complaints of swelling and redness to left foot, primarily around 3-5th toes, after he accidentally kicked a wooden box which contained weights. Mild pain. History of neuropathy, no change to sensation. 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 Ambulatory. 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 Noted swelling yesterday but had kicked the box three days ago.     ROS per HPI, negative if not otherwise mentioned.      Past Medical History:  Diagnosis Date  . DVT (deep venous thrombosis) ([facility])   . History of BPH   . Hypercholesteremia   . Neurogenic bladder   . Stroke [facility])     There are no problems to display for this patient.   Past Surgical History:  Procedure Laterality Date  . BACK SURGERY         Home Medications    Prior to Admission medications   Medication Sig Start Date End Date Taking? Authorizing Provider  aspirin EC 81 MG tablet Take 81 mg by mouth daily.    [provider]  calcium-vitamin D 250-100 MG-UNIT tablet Take 1 tablet by mouth 2 (two) times daily.    [provider]  co-enzyme Q-10 30 MG capsule Take 30 mg by mouth 3 (three) times daily.    [provider]  ezetimibe (ZETIA) 10 MG tablet Take 10 mg by mouth daily.    [provider]  finasteride (PROPECIA) 1 MG tablet Take 1 mg by mouth daily.    [provider]  magnesium oxide (MAG-OX) 400 MG tablet Take 400 mg by mouth daily.    [provider]  Multiple Vitamin (MULTIVITAMIN) capsule Take 1 capsule by mouth daily.    [provider]  Omega-3 Fatty Acids (FISH OIL ADULT GUMMIES PO) Take by mouth.    [provider]  Rivaroxaban (XARELTO PO) Take by mouth.     [provider]  tamsulosin (FLOMAX) 0.4 MG CAPS capsule Take 0.4 mg by mouth.    [provider]    Family History Family History  Problem Relation Age of Onset  . Diabetes Mother   . Colon cancer Mother   . Stroke Father   . Hypertension Father     Social History Social History   Tobacco Use  . Smoking status: Never Smoker  . Smokeless tobacco: Never Used  Vaping Use  . Vaping Use: Never used  Substance Use Topics  . Alcohol use: No  . Drug use: Never     Allergies   Codeine, Methocarbamol, Tadalafil, and Tramadol   Review of Systems Review of Systems   Physical Exam Triage Vital Signs ED Triage Vitals [08/29/19 1302]  Enc Vitals Group     BP (!) 134/94     Pulse Rate 88     Resp 18     Temp 98 F (36.7 C)     Temp Source Oral     SpO2 96 %     Weight      Height      Head Circumference      Peak Flow      Pain Score 8  Pain Loc      Pain Edu?      Excl. in GC?    No data found.  Updated Vital Signs BP (!) 134/94 (BP Location: Right Arm)   Pulse 88   Temp 98 F (36.7 C) (Oral)   Resp 18   SpO2 96%   Visual Acuity Right Eye Distance:   Left Eye Distance:   Bilateral Distance:    Right Eye Near:   Left Eye Near:    Bilateral Near:     Physical Exam Constitutional:      Appearance: He is well-developed.  Cardiovascular:     Rate and Rhythm: Normal rate.  Pulmonary:     Effort: Pulmonary effort is normal.  Musculoskeletal:     Left foot: Swelling, tenderness and bony tenderness present.     Comments: Tenderness to distal 4th and 5th metatarsals, 4th and 5th MTP joints, with swelling present; cap refill < 2 seconds    Skin:    General: Skin is warm and dry.  Neurological:     Mental Status: He is alert and oriented to person, place, and time.      UC Treatments / Results  Labs (all labs ordered are listed, but only abnormal results are displayed) Labs Reviewed - No data to  display  EKG   Radiology DG Foot Complete Left  Result Date: 08/29/2019 CLINICAL DATA:  Left foot pain since an injury when the patient jammed his foot on hand weights today. Initial encounter. EXAM: LEFT FOOT - COMPLETE 3+ VIEW COMPARISON:  None. FINDINGS: The patient has a transverse, nondisplaced fracture through the proximal metaphysis of the proximal phalanx of the little toe. No other fracture is identified. No dislocation. No evidence of arthropathy. IMPRESSION: Nondisplaced transverse fracture base of the proximal phalanx of the little toe. Electronically Signed   By: Drusilla Kanner M.D.   On: 08/29/2019 14:42    Procedures Procedures (including critical care time)  Medications Ordered in UC Medications - No data to display  Initial Impression / Assessment and Plan / UC Course  I have reviewed the triage vital signs and the nursing notes.  Pertinent labs & imaging results that were available during my care of the patient were reviewed by me and considered in my medical decision making (see chart for details).     Pinky toe fracture on xray, consistent with injury and pain. Pain management discussed. Post op shoe placed. Podiatry follow up prn. Return precautions provided. Patient verbalized understanding and agreeable to plan.  Ambulatory out of clinic without difficulty.    Final Clinical Impressions(s) / UC Diagnoses   Final diagnoses:  Closed nondisplaced fracture of proximal phalanx of lesser toe of left foot, initial encounter     Discharge Instructions     Ice, elevate and limit activity as able to help with swelling.  Use of the shoe provided, for the next 2-3 weeks to allow for healing.  Follow up with podiatry as needed for any persistent pain or swelling.  Return to be seen for any worsening of symptoms or additional concern   EXAM: LEFT FOOT - COMPLETE 3+ VIEW   COMPARISON:  None.   FINDINGS: The patient has a transverse, nondisplaced fracture  through the proximal metaphysis of the proximal phalanx of the little toe. No other fracture is identified. No dislocation. No evidence of arthropathy.   IMPRESSION: Nondisplaced transverse fracture base of the proximal phalanx of the little toe.     Electronically Signed  By: Drusilla Kanner M.D.   On: 08/29/2019 14:42    ED Prescriptions    None     PDMP not reviewed this encounter.   Georgetta Haber, NP 08/29/19 1459

## 2019-08-29 NOTE — ED Triage Notes (Signed)
Pt here for left foot pain with swelling and bruising after hitting on heavy box 3 days ago; pt sts hx of DVT in same leg taking xerelto

## 2019-08-29 NOTE — ED Triage Notes (Signed)
Patient states he kicked a wooden box that had weights in there, hurting his left 4th and 5th toe on Sunday.

## 2019-08-29 NOTE — Discharge Instructions (Addendum)
Ice, elevate and limit activity as able to help with swelling.  Use of the shoe provided, for the next 2-3 weeks to allow for healing.  Follow up with podiatry as needed for any persistent pain or swelling.  Return to be seen for any worsening of symptoms or additional concern   EXAM: LEFT FOOT - COMPLETE 3+ VIEW   COMPARISON:  None.   FINDINGS: The patient has a transverse, nondisplaced fracture through the proximal metaphysis of the proximal phalanx of the little toe. No other fracture is identified. No dislocation. No evidence of arthropathy.   IMPRESSION: Nondisplaced transverse fracture base of the proximal phalanx of the little toe.     Electronically Signed   By: Drusilla Kanner M.D.   On: 08/29/2019 14:42

## 2020-08-12 ENCOUNTER — Ambulatory Visit
Admission: RE | Admit: 2020-08-12 | Discharge: 2020-08-12 | Disposition: A | Discharge status: Home / Self Care | Payer: Medicare PPO | Source: Ambulatory Visit | Attending: Emergency Medicine | Admitting: Emergency Medicine

## 2020-08-12 ENCOUNTER — Other Ambulatory Visit: Payer: Self-pay

## 2020-08-12 VITALS — BP 118/81 | HR 102 | Temp 98.1°F | Resp 20

## 2020-08-12 DIAGNOSIS — U071 COVID-19: Secondary | ICD-10-CM

## 2020-08-12 MED ORDER — BENZONATATE 200 MG PO CAPS: 200.0000 mg | ORAL_CAPSULE | Freq: Three times a day (TID) | ORAL | 0 refills | Status: AC | PRN

## 2020-08-12 MED ORDER — MOLNUPIRAVIR EUA 200MG CAPSULE: 4.0000 | ORAL_CAPSULE | Freq: Two times a day (BID) | ORAL | 0 refills | Status: AC

## 2020-08-12 NOTE — Discharge Instructions (Addendum)
May begin molnupravir 800 mg twice daily x5 days Tessalon every 8 hours for cough May use over-the-counter Mucinex to further help with congestion/drainage Rest and fluids Tylenol for any headaches, body aches, fevers Follow-up for any concerns

## 2020-08-12 NOTE — ED Triage Notes (Signed)
Pt c/o cough, achy joints, and fatigue since Sunday. States had a positive home covid test yesterday.

## 2020-08-12 NOTE — ED Provider Notes (Signed)
[facility]    CSN: [id number] Arrival date & time: 08/12/20  1044      History   Chief Complaint Chief Complaint  Patient presents with   appt - 11- COVID +   Cough    HPI Seth Hicks is a 80 y.o. male history of prior CVA, BPH, prior DVT presenting today for evaluation of COVID.  Reports developed cough and congestion approximately 2 to 3 days ago-symptoms began 08/10/2020.  At home COVID test positive yesterday-08/11/2020.  He has had a lot of fatigue muscle aches.  Denies history of lung problems.  Is on Xarelto.  Does express interest in starting antivirals.   HPI  Past Medical History:  Diagnosis Date   DVT (deep venous thrombosis) ([facility])    History of BPH    Hypercholesteremia    Neurogenic bladder    Stroke ([facility])     There are no problems to display for this patient.   Past Surgical History:  Procedure Laterality Date   BACK SURGERY         Home Medications    Prior to Admission medications   Medication Sig Start Date End Date Taking? Authorizing Provider  benzonatate (TESSALON) 200 MG capsule Take 1 capsule (200 mg total) by mouth 3 (three) times daily as needed for up to 7 days for cough. 08/12/20 08/19/20 Yes Jaeson Molstad C, PA-C  molnupiravir EUA 200 mg CAPS Take 4 capsules (800 mg total) by mouth 2 (two) times daily for 5 days. 08/12/20 08/17/20 Yes Krikor Willet C, PA-C  aspirin EC 81 MG tablet Take 81 mg by mouth daily.    [provider]  calcium-vitamin D 250-100 MG-UNIT tablet Take 1 tablet by mouth 2 (two) times daily.    [provider]  co-enzyme Q-10 30 MG capsule Take 30 mg by mouth 3 (three) times daily.    [provider]  ezetimibe (ZETIA) 10 MG tablet Take 10 mg by mouth daily.    [provider]  finasteride (PROPECIA) 1 MG tablet Take 1 mg by mouth daily.    [provider]  magnesium oxide (MAG-OX) 400 MG tablet Take 400 mg by mouth daily.    [provider]   Multiple Vitamin (MULTIVITAMIN) capsule Take 1 capsule by mouth daily.    [provider]  Omega-3 Fatty Acids (FISH OIL ADULT GUMMIES PO) Take by mouth.    [provider]  Rivaroxaban (XARELTO PO) Take by mouth.    [provider]  tamsulosin (FLOMAX) 0.4 MG CAPS capsule Take 0.4 mg by mouth.    [provider]    Family History Family History  Problem Relation Age of Onset   Diabetes Mother    Colon cancer Mother    Stroke Father    Hypertension Father     Social History Social History   Tobacco Use   Smoking status: Never   Smokeless tobacco: Never  Vaping Use   Vaping Use: Never used  Substance Use Topics   Alcohol use: No   Drug use: Never     Allergies   Codeine, Methocarbamol, Tadalafil, and Tramadol   Review of Systems Review of Systems  Constitutional:  Positive for fatigue and fever. Negative for activity change, appetite change and chills.  HENT:  Positive for congestion, rhinorrhea and sore throat. Negative for ear pain, sinus pressure and trouble swallowing.   Eyes:  Negative for discharge and redness.  Respiratory:  Positive for cough. Negative for chest  tightness and shortness of breath.   Cardiovascular:  Negative for chest pain.  Gastrointestinal:  Negative for abdominal pain, diarrhea, nausea and vomiting.  Musculoskeletal:  Positive for myalgias.  Skin:  Negative for rash.  Neurological:  Negative for dizziness, light-headedness and headaches.    Physical Exam Triage Vital Signs ED Triage Vitals  Enc Vitals Group     BP      Pulse      Resp      Temp      Temp src      SpO2      Weight      Height      Head Circumference      Peak Flow      Pain Score      Pain Loc      Pain Edu?      Excl. in GC?    No data found.  Updated Vital Signs BP 118/81 (BP Location: Left Arm)   Pulse (!) 102   Temp 98.1 F (36.7 C) (Oral)   Resp 20   SpO2 91%   Visual Acuity Right Eye Distance:   Left Eye  Distance:   Bilateral Distance:    Right Eye Near:   Left Eye Near:    Bilateral Near:     Physical Exam Vitals and nursing note reviewed.  Constitutional:      Appearance: He is well-developed.     Comments: No acute distress  HENT:     Head: Normocephalic and atraumatic.     Nose: Nose normal.     Mouth/Throat:     Comments: Oral mucosa pink and moist, no tonsillar enlargement or exudate. Posterior pharynx patent and nonerythematous, no uvula deviation or swelling. Normal phonation.  Eyes:     Conjunctiva/sclera: Conjunctivae normal.  Cardiovascular:     Rate and Rhythm: Normal rate and regular rhythm.  Pulmonary:     Effort: Pulmonary effort is normal. No respiratory distress.     Comments: Breathing comfortably at rest, CTABL, no wheezing, rales or other adventitious sounds auscultated  Oxygen rechecked and was 95/96% Abdominal:     General: There is no distension.  Musculoskeletal:        General: Normal range of motion.     Cervical back: Neck supple.  Skin:    General: Skin is warm and dry.  Neurological:     Mental Status: He is alert and oriented to person, place, and time.     UC Treatments / Results  Labs (all labs ordered are listed, but only abnormal results are displayed) Labs Reviewed - No data to display  EKG   Radiology No results found.  Procedures Procedures (including critical care time)  Medications Ordered in UC Medications - No data to display  Initial Impression / Assessment and Plan / UC Course  I have reviewed the triage vital signs and the nursing notes.  Pertinent labs & imaging results that were available during my care of the patient were reviewed by me and considered in my medical decision making (see chart for details).     COVID-19 infection-at home test positive, day 3 of symptoms, is on Xarelto, Paxlovid, will prescribe molnupravir.   Discussed strict return precautions. Patient verbalized understanding and is  agreeable with plan.  Final Clinical Impressions(s) / UC Diagnoses   Final diagnoses:  COVID-19     Discharge Instructions      May begin molnupravir 800 mg twice daily x5 days Tessalon every 8 hours  for cough May use over-the-counter Mucinex to further help with congestion/drainage Rest and fluids Tylenol for any headaches, body aches, fevers Follow-up for any concerns     ED Prescriptions     Medication Sig Dispense Auth. Provider   molnupiravir EUA 200 mg CAPS Take 4 capsules (800 mg total) by mouth 2 (two) times daily for 5 days. 40 capsule Pearl Bents C, PA-C   benzonatate (TESSALON) 200 MG capsule Take 1 capsule (200 mg total) by mouth 3 (three) times daily as needed for up to 7 days for cough. 28 capsule Jalaila Caradonna, [place] C, PA-C      PDMP not reviewed this encounter.   Lew Dawes, [place] 08/12/20 1238
